# Patient Record
Sex: Male | Born: 1985 | Race: Black or African American | Hispanic: No | Marital: Single | Smoking: Never smoker
Health system: Southern US, Community
[De-identification: ages and names within clinical notes are randomized; demographics above are authoritative.]

## PROBLEM LIST (undated history)

## (undated) HISTORY — PX: BACK SURGERY: SHX140

---

## 2006-03-04 ENCOUNTER — Inpatient Hospital Stay (HOSPITAL_COMMUNITY): Admission: EM | Admit: 2006-03-04 | Discharge: 2006-03-08 | Payer: Self-pay | Admitting: Emergency Medicine
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
## 2014-02-14 ENCOUNTER — Encounter (HOSPITAL_COMMUNITY): Payer: Self-pay | Admitting: Emergency Medicine

## 2014-02-14 ENCOUNTER — Emergency Department (HOSPITAL_COMMUNITY)
Admission: EM | Admit: 2014-02-14 | Discharge: 2014-02-14 | Disposition: A | Discharge status: Home / Self Care | Payer: No Typology Code available for payment source | Attending: Emergency Medicine | Admitting: Emergency Medicine

## 2014-02-14 ENCOUNTER — Emergency Department (HOSPITAL_COMMUNITY): Payer: No Typology Code available for payment source

## 2014-02-14 DIAGNOSIS — Y998 Other external cause status: Secondary | ICD-10-CM | POA: Insufficient documentation

## 2014-02-14 DIAGNOSIS — S3992XA Unspecified injury of lower back, initial encounter: Secondary | ICD-10-CM | POA: Insufficient documentation

## 2014-02-14 DIAGNOSIS — S161XXA Strain of muscle, fascia and tendon at neck level, initial encounter: Secondary | ICD-10-CM | POA: Diagnosis not present

## 2014-02-14 DIAGNOSIS — Y9389 Activity, other specified: Secondary | ICD-10-CM | POA: Diagnosis not present

## 2014-02-14 DIAGNOSIS — Z9889 Other specified postprocedural states: Secondary | ICD-10-CM | POA: Insufficient documentation

## 2014-02-14 DIAGNOSIS — Y92410 Unspecified street and highway as the place of occurrence of the external cause: Secondary | ICD-10-CM | POA: Insufficient documentation

## 2014-02-14 DIAGNOSIS — S199XXA Unspecified injury of neck, initial encounter: Secondary | ICD-10-CM | POA: Diagnosis present

## 2014-02-14 DIAGNOSIS — T1490XA Injury, unspecified, initial encounter: Secondary | ICD-10-CM

## 2014-02-14 MED ORDER — IBUPROFEN 200 MG PO TABS
600.0000 mg | ORAL_TABLET | Freq: Once | ORAL | Status: AC
  Administered 2014-02-14: 600 mg via ORAL
  Filled 2014-02-14: qty 3

## 2014-02-14 MED ORDER — CYCLOBENZAPRINE HCL 5 MG PO TABS: 5.0000 mg | ORAL_TABLET | Freq: Three times a day (TID) | ORAL | Status: AC | PRN

## 2014-02-14 MED ORDER — IBUPROFEN 600 MG PO TABS
600.0000 mg | ORAL_TABLET | Freq: Four times a day (QID) | ORAL | Status: AC | PRN
Start: 2014-02-14 — End: ?
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
## 2014-02-14 NOTE — Discharge Instructions (Signed)
Cryotherapy °Cryotherapy means treatment with cold. Ice or gel packs can be used to reduce both pain and swelling. Ice is the most helpful within the first 24 to 48 hours after an injury or flare-up from overusing a muscle or joint. Sprains, strains, spasms, burning pain, shooting pain, and aches can all be eased with ice. Ice can also be used when recovering from surgery. Ice is effective, has very few side effects, and is safe for most people to use. °PRECAUTIONS  °Ice is not a safe treatment option for people with: °· Raynaud phenomenon. This is a condition affecting small blood vessels in the extremities. Exposure to cold may cause your problems to return. °· Cold hypersensitivity. There are many forms of cold hypersensitivity, including: °¨ Cold urticaria. Red, itchy hives appear on the skin when the tissues begin to warm after being iced. °¨ Cold erythema. This is a red, itchy rash caused by exposure to cold. °¨ Cold hemoglobinuria. Red blood cells break down when the tissues begin to warm after being iced. The hemoglobin that carry oxygen are passed into the urine because they cannot combine with blood proteins fast enough. °· Numbness or altered sensitivity in the area being iced. °If you have any of the following conditions, do not use ice until you have discussed cryotherapy with your caregiver: °· Heart conditions, such as arrhythmia, angina, or chronic heart disease. °· High blood pressure. °· Healing wounds or open skin in the area being iced. °· Current infections. °· Rheumatoid arthritis. °· Poor circulation. °· Diabetes. °Ice slows the blood flow in the region it is applied. This is beneficial when trying to stop inflamed tissues from spreading irritating chemicals to surrounding tissues. However, if you expose your skin to cold temperatures for too long or without the proper protection, you can damage your skin or nerves. Watch for signs of skin damage due to cold. °HOME CARE INSTRUCTIONS °Follow  these tips to use ice and cold packs safely. °· Place a dry or damp towel between the ice and skin. A damp towel will cool the skin more quickly, so you may need to shorten the time that the ice is used. °· For a more rapid response, add gentle compression to the ice. °· Ice for no more than 10 to 20 minutes at a time. The bonier the area you are icing, the less time it will take to get the benefits of ice. °· Check your skin after 5 minutes to make sure there are no signs of a poor response to cold or skin damage. °· Rest 20 minutes or more between uses. °· Once your skin is numb, you can end your treatment. You can test numbness by very lightly touching your skin. The touch should be so light that you do not see the skin dimple from the pressure of your fingertip. When using ice, most people will feel these normal sensations in this order: cold, burning, aching, and numbness. °· Do not use ice on someone who cannot communicate their responses to pain, such as small children or people with dementia. °HOW TO MAKE AN ICE PACK °Ice packs are the most common way to use ice therapy. Other methods include ice massage, ice baths, and cryosprays. Muscle creams that cause a cold, tingly feeling do not offer the same benefits that ice offers and should not be used as a substitute unless recommended by your caregiver. °To make an ice pack, do one of the following: °· Place crushed ice or a   bag of frozen vegetables in a sealable plastic bag. Squeeze out the excess air. Place this bag inside another plastic bag. Slide the bag into a pillowcase or place a damp towel between your skin and the bag.  Mix 3 parts water with 1 part rubbing alcohol. Freeze the mixture in a sealable plastic bag. When you remove the mixture from the freezer, it will be slushy. Squeeze out the excess air. Place this bag inside another plastic bag. Slide the bag into a pillowcase or place a damp towel between your skin and the bag. SEEK MEDICAL CARE  IF:  You develop white spots on your skin. This may give the skin a blotchy (mottled) appearance.  Your skin turns blue or pale.  Your skin becomes waxy or hard.  Your swelling gets worse. MAKE SURE YOU:   Understand these instructions.  Will watch your condition.  Will get help right away if you are not doing well or get worse. Document Released: 09/20/2010 Document Revised: 06/10/2013 Document Reviewed: 09/20/2010 [facility] Patient Information 2015 [place]. This information is not intended to replace advice given to you by your health care provider. Make sure you discuss any questions you have with your health care provider.  Cervical Sprain A cervical sprain is when the tissues (ligaments) that hold the neck bones in place stretch or tear. HOME CARE   Put ice on the injured area.  Put ice in a plastic bag.  Place a towel between your skin and the bag.  Leave the ice on for 15-20 minutes, 3-4 times a day.  You may have been given a collar to wear. This collar keeps your neck from moving while you heal.  Do not take the collar off unless told by your doctor.  If you have long hair, keep it outside of the collar.  Ask your doctor before changing the position of your collar. You may need to change its position over time to make it more comfortable.  If you are allowed to take off the collar for cleaning or bathing, follow your doctor's instructions on how to do it safely.  Keep your collar clean by wiping it with mild soap and water. Dry it completely. If the collar has removable pads, remove them every 1-2 days to hand wash them with soap and water. Allow them to air dry. They should be dry before you wear them in the collar.  Do not drive while wearing the collar.  Only take medicine as told by your doctor.  Keep all doctor visits as told.  Keep all physical therapy visits as told.  Adjust your work station so that you have good posture while you  work.  Avoid positions and activities that make your problems worse.  Warm up and stretch before being active. GET HELP IF:  Your pain is not controlled with medicine.  You cannot take less pain medicine over time as planned.  Your activity level does not improve as expected. GET HELP RIGHT AWAY IF:   You are bleeding.  Your stomach is upset.  You have an allergic reaction to your medicine.  You develop new problems that you cannot explain.  You lose feeling (become numb) or you cannot move any part of your body (paralysis).  You have tingling or weakness in any part of your body.  Your symptoms get worse. Symptoms include:  Pain, soreness, stiffness, puffiness (swelling), or a burning feeling in your neck.  Pain when your neck is touched.  Shoulder  or upper back pain.  Limited ability to move your neck.  Headache.  Dizziness.  Your hands or arms feel week, lose feeling, or tingle.  Muscle spasms.  Difficulty swallowing or chewing. MAKE SURE YOU:   Understand these instructions.  Will watch your condition.  Will get help right away if you are not doing well or get worse. Document Released: 07/13/2007 Document Revised: 09/26/2012 Document Reviewed: 08/01/2012 [facility] Patient Information 2015 [place], [place]. This information is not intended to replace advice given to you by your health care provider. Make sure you discuss any questions you have with your health care provider. The x-ray of your cervical and thoracic spine are normal.  Your previous C6-T1 fusion is intact given, given ibuprofen and Flexeril for your discomfort
